# Patient Record
Sex: Male | Born: 1950 | Race: White | Hispanic: No | Marital: Married | Smoking: Former smoker
Health system: Southern US, Community
[De-identification: ages and names within clinical notes are randomized; demographics above are authoritative.]

## PROBLEM LIST (undated history)

## (undated) DIAGNOSIS — I2510 Atherosclerotic heart disease of native coronary artery without angina pectoris: Secondary | ICD-10-CM

## (undated) DIAGNOSIS — E119 Type 2 diabetes mellitus without complications: Secondary | ICD-10-CM

## (undated) DIAGNOSIS — I10 Essential (primary) hypertension: Secondary | ICD-10-CM

---

## 1998-06-01 ENCOUNTER — Emergency Department (HOSPITAL_COMMUNITY): Admission: EM | Admit: 1998-06-01 | Discharge: 1998-06-01 | Payer: Self-pay | Admitting: Emergency Medicine

## 1998-06-01 ENCOUNTER — Encounter: Payer: Self-pay | Admitting: Emergency Medicine

## 1998-06-01 ENCOUNTER — Ambulatory Visit (HOSPITAL_BASED_OUTPATIENT_CLINIC_OR_DEPARTMENT_OTHER): Admission: RE | Admit: 1998-06-01 | Discharge: 1998-06-01 | Payer: Self-pay | Admitting: Orthopedic Surgery

## 2010-07-09 ENCOUNTER — Ambulatory Visit (HOSPITAL_BASED_OUTPATIENT_CLINIC_OR_DEPARTMENT_OTHER)
Admission: RE | Admit: 2010-07-09 | Discharge: 2010-07-09 | Disposition: A | Discharge status: Home / Self Care | Payer: BC Managed Care – PPO | Source: Ambulatory Visit | Attending: Orthopedic Surgery | Admitting: Orthopedic Surgery

## 2010-07-09 DIAGNOSIS — X58XXXA Exposure to other specified factors, initial encounter: Secondary | ICD-10-CM | POA: Insufficient documentation

## 2010-07-09 DIAGNOSIS — IMO0002 Reserved for concepts with insufficient information to code with codable children: Secondary | ICD-10-CM | POA: Insufficient documentation

## 2010-07-09 DIAGNOSIS — Z01812 Encounter for preprocedural laboratory examination: Secondary | ICD-10-CM | POA: Insufficient documentation

## 2010-07-09 LAB — POCT I-STAT 4, (NA,K, GLUC, HGB,HCT)
Glucose, Bld: 124 mg/dL — ABNORMAL HIGH (ref 70–99)
HCT: 44 % (ref 39.0–52.0)
Hemoglobin: 15 g/dL (ref 13.0–17.0)
Potassium: 3.9 mEq/L (ref 3.5–5.1)
Sodium: 139 mEq/L (ref 135–145)

## 2010-07-13 ENCOUNTER — Ambulatory Visit (HOSPITAL_BASED_OUTPATIENT_CLINIC_OR_DEPARTMENT_OTHER)
Admission: RE | Admit: 2010-07-13 | Discharge: 2010-07-13 | Disposition: A | Discharge status: Home / Self Care | Payer: BC Managed Care – PPO | Source: Ambulatory Visit | Attending: Orthopedic Surgery | Admitting: Orthopedic Surgery

## 2010-07-13 DIAGNOSIS — Z01812 Encounter for preprocedural laboratory examination: Secondary | ICD-10-CM | POA: Insufficient documentation

## 2010-07-13 DIAGNOSIS — IMO0002 Reserved for concepts with insufficient information to code with codable children: Secondary | ICD-10-CM | POA: Insufficient documentation

## 2010-07-13 DIAGNOSIS — M6750 Plica syndrome, unspecified knee: Secondary | ICD-10-CM | POA: Insufficient documentation

## 2010-07-13 DIAGNOSIS — M2240 Chondromalacia patellae, unspecified knee: Secondary | ICD-10-CM | POA: Insufficient documentation

## 2010-07-13 DIAGNOSIS — X58XXXA Exposure to other specified factors, initial encounter: Secondary | ICD-10-CM | POA: Insufficient documentation

## 2010-07-13 LAB — GLUCOSE, CAPILLARY
Glucose-Capillary: 145 mg/dL — ABNORMAL HIGH (ref 70–99)
Glucose-Capillary: 163 mg/dL — ABNORMAL HIGH (ref 70–99)

## 2010-09-14 NOTE — Op Note (Signed)
  NAMEGRAEDEN, BITNER NO.:  [id number]  MEDICAL RECORD NO.:  [id number]          PATIENT TYPE:  AMB  LOCATION:  [facility]                         FACILITY:  [facility]  PHYSICIAN:  Almedia Balls. Ranell Patrick, M.D. DATE OF BIRTH:  1950/08/26  DATE OF PROCEDURE:  07/13/2010 DATE OF DISCHARGE:                              OPERATIVE REPORT   POSTOPERATIVE DIAGNOSIS:  Left knee medial meniscus tear.  POSTOPERATIVE DIAGNOSES: 1. Left knee medial meniscus tear. 2. Left knee medial plica shelf syndrome.  PROCEDURE PERFORMED:  Left knee arthroscopy with partial medial meniscectomy and plica excision.  ATTENDING SURGEON:  Almedia Balls. Ranell Patrick, M.D.  ASSISTANT:  None.  ANESTHESIA:  General anesthesia with MAC was used plus local.  ESTIMATED BLOOD LOSS:  Minimal.  FLUID REPLACEMENT:  800 cc crystalloid.  INSTRUMENT COUNTS:  Correct.  COMPLICATIONS.:  There were no complications.  Perioperative antibiotics were given.  INDICATIONS:  The patient is a 60 year old male with MRI documented torn medial meniscus.  He has failed conservative management, desires operative treatment to restore function and eliminate pain to his left knee.  DESCRIPTION OF PROCEDURE:  Informed consent was obtained.  After an adequate level of anesthesia achieved, time-out was called, left leg correctly identified, placement of arthroscopic leg holder, right leg placed in well leg holder after sterile prep and drape of the left knee. We entered the knee using standard portals including superolateral outflow, anterolateral scope, and anteromedial working portals.  We identified normal patellofemoral articular cartilage.  No signs of any chondromalacia.  There was mild hyperemia present of the joint and there was a large medial plica with engaged medial femoral condyle, there was some abraded cartilage there where the plica engaged.  We removed the plica using basket forceps and motorized shaver.  Medial  compartment was entered and we found a torn medial  meniscus.  The meniscus was torn in the posterior portion extending towards the middle part of meniscus, portion of meniscus was flipped back up on to the meniscus and incarcerated out of the tibial portion, we performed a partial meniscectomy using basket forceps and motorized shaver back to stable meniscal rim, about 30% to 40% of posterior horn of the medial meniscus had to be removed.  Remaining meniscus was probed and felt to be stable. There was minimal chondromalacia on the femur and tibia in that compartment.  ACL, PCL intact.  Lateral compartment pristine with no meniscal tear or articular cartilage defect.  Did a final inspection of the knee making sure there were no other loose pieces of cartilage. Once we were satisfied the gutters were clear and the meniscus debridement and plical excision was completed then we concluded surgery, suturing the wounds with 4-0 Monocryl followed by Steri-Strips and sterile compressive bandage.  The patient tolerated the surgery well.     Almedia Balls. Ranell Patrick, M.D.     SRN/MEDQ  D:  07/13/2010  T:  07/13/2010  Job:  [id number]  Electronically Signed by Malon Kindle  on 09/14/2010 12:04:23 AM

## 2014-07-24 DIAGNOSIS — R200 Anesthesia of skin: Secondary | ICD-10-CM | POA: Diagnosis not present

## 2014-07-24 DIAGNOSIS — R739 Hyperglycemia, unspecified: Secondary | ICD-10-CM | POA: Diagnosis not present

## 2014-07-24 DIAGNOSIS — I10 Essential (primary) hypertension: Secondary | ICD-10-CM | POA: Diagnosis not present

## 2014-07-24 DIAGNOSIS — M179 Osteoarthritis of knee, unspecified: Secondary | ICD-10-CM | POA: Diagnosis not present

## 2014-07-24 DIAGNOSIS — A681 Tick-borne relapsing fever: Secondary | ICD-10-CM | POA: Diagnosis not present

## 2014-07-24 DIAGNOSIS — R202 Paresthesia of skin: Secondary | ICD-10-CM | POA: Diagnosis not present

## 2014-08-01 DIAGNOSIS — I10 Essential (primary) hypertension: Secondary | ICD-10-CM | POA: Diagnosis not present

## 2014-08-01 DIAGNOSIS — A932 Colorado tick fever: Secondary | ICD-10-CM | POA: Diagnosis not present

## 2014-08-01 DIAGNOSIS — A681 Tick-borne relapsing fever: Secondary | ICD-10-CM | POA: Diagnosis not present

## 2014-08-01 DIAGNOSIS — R0602 Shortness of breath: Secondary | ICD-10-CM | POA: Diagnosis not present

## 2014-08-23 DIAGNOSIS — G629 Polyneuropathy, unspecified: Secondary | ICD-10-CM | POA: Diagnosis not present

## 2014-08-23 DIAGNOSIS — M1990 Unspecified osteoarthritis, unspecified site: Secondary | ICD-10-CM | POA: Diagnosis not present

## 2014-08-23 DIAGNOSIS — E780 Pure hypercholesterolemia: Secondary | ICD-10-CM | POA: Diagnosis not present

## 2014-08-23 DIAGNOSIS — E1165 Type 2 diabetes mellitus with hyperglycemia: Secondary | ICD-10-CM | POA: Diagnosis not present

## 2014-08-26 DIAGNOSIS — D510 Vitamin B12 deficiency anemia due to intrinsic factor deficiency: Secondary | ICD-10-CM | POA: Diagnosis not present

## 2014-09-02 DIAGNOSIS — D510 Vitamin B12 deficiency anemia due to intrinsic factor deficiency: Secondary | ICD-10-CM | POA: Diagnosis not present

## 2014-09-08 DIAGNOSIS — D510 Vitamin B12 deficiency anemia due to intrinsic factor deficiency: Secondary | ICD-10-CM | POA: Diagnosis not present

## 2014-09-16 DIAGNOSIS — D510 Vitamin B12 deficiency anemia due to intrinsic factor deficiency: Secondary | ICD-10-CM | POA: Diagnosis not present

## 2014-09-23 DIAGNOSIS — M79672 Pain in left foot: Secondary | ICD-10-CM | POA: Diagnosis not present

## 2014-09-23 DIAGNOSIS — Z0000 Encounter for general adult medical examination without abnormal findings: Secondary | ICD-10-CM | POA: Diagnosis not present

## 2014-09-23 DIAGNOSIS — D510 Vitamin B12 deficiency anemia due to intrinsic factor deficiency: Secondary | ICD-10-CM | POA: Diagnosis not present

## 2014-09-23 DIAGNOSIS — G8929 Other chronic pain: Secondary | ICD-10-CM | POA: Diagnosis not present

## 2014-09-23 DIAGNOSIS — E1143 Type 2 diabetes mellitus with diabetic autonomic (poly)neuropathy: Secondary | ICD-10-CM | POA: Diagnosis not present

## 2014-11-12 ENCOUNTER — Encounter (HOSPITAL_COMMUNITY): Payer: Self-pay | Admitting: Emergency Medicine

## 2014-11-12 ENCOUNTER — Emergency Department (HOSPITAL_COMMUNITY)
Admission: EM | Admit: 2014-11-12 | Discharge: 2014-11-13 | Disposition: A | Discharge status: Home / Self Care | Payer: Medicare Other | Attending: Emergency Medicine | Admitting: Emergency Medicine

## 2014-11-12 ENCOUNTER — Emergency Department (HOSPITAL_COMMUNITY): Payer: Medicare Other

## 2014-11-12 DIAGNOSIS — T1490XA Injury, unspecified, initial encounter: Secondary | ICD-10-CM

## 2014-11-12 DIAGNOSIS — Y998 Other external cause status: Secondary | ICD-10-CM | POA: Diagnosis not present

## 2014-11-12 DIAGNOSIS — E119 Type 2 diabetes mellitus without complications: Secondary | ICD-10-CM | POA: Insufficient documentation

## 2014-11-12 DIAGNOSIS — Y9389 Activity, other specified: Secondary | ICD-10-CM | POA: Insufficient documentation

## 2014-11-12 DIAGNOSIS — I447 Left bundle-branch block, unspecified: Secondary | ICD-10-CM | POA: Insufficient documentation

## 2014-11-12 DIAGNOSIS — Z880 Allergy status to penicillin: Secondary | ICD-10-CM | POA: Diagnosis not present

## 2014-11-12 DIAGNOSIS — S62514A Nondisplaced fracture of proximal phalanx of right thumb, initial encounter for closed fracture: Secondary | ICD-10-CM | POA: Diagnosis not present

## 2014-11-12 DIAGNOSIS — S62201A Unspecified fracture of first metacarpal bone, right hand, initial encounter for closed fracture: Secondary | ICD-10-CM | POA: Diagnosis not present

## 2014-11-12 DIAGNOSIS — Z041 Encounter for examination and observation following transport accident: Secondary | ICD-10-CM | POA: Diagnosis not present

## 2014-11-12 DIAGNOSIS — S6991XA Unspecified injury of right wrist, hand and finger(s), initial encounter: Secondary | ICD-10-CM | POA: Diagnosis present

## 2014-11-12 DIAGNOSIS — I10 Essential (primary) hypertension: Secondary | ICD-10-CM | POA: Insufficient documentation

## 2014-11-12 DIAGNOSIS — R000 Tachycardia, unspecified: Secondary | ICD-10-CM | POA: Diagnosis not present

## 2014-11-12 DIAGNOSIS — M79641 Pain in right hand: Secondary | ICD-10-CM | POA: Diagnosis not present

## 2014-11-12 DIAGNOSIS — Y92410 Unspecified street and highway as the place of occurrence of the external cause: Secondary | ICD-10-CM | POA: Insufficient documentation

## 2014-11-12 DIAGNOSIS — Z87891 Personal history of nicotine dependence: Secondary | ICD-10-CM | POA: Diagnosis not present

## 2014-11-12 DIAGNOSIS — R0689 Other abnormalities of breathing: Secondary | ICD-10-CM | POA: Diagnosis not present

## 2014-11-12 DIAGNOSIS — S0081XA Abrasion of other part of head, initial encounter: Secondary | ICD-10-CM | POA: Diagnosis not present

## 2014-11-12 DIAGNOSIS — T148 Other injury of unspecified body region: Secondary | ICD-10-CM | POA: Diagnosis not present

## 2014-11-12 DIAGNOSIS — S62511A Displaced fracture of proximal phalanx of right thumb, initial encounter for closed fracture: Secondary | ICD-10-CM | POA: Diagnosis not present

## 2014-11-12 DIAGNOSIS — I2510 Atherosclerotic heart disease of native coronary artery without angina pectoris: Secondary | ICD-10-CM | POA: Diagnosis not present

## 2014-11-12 DIAGNOSIS — S3991XA Unspecified injury of abdomen, initial encounter: Secondary | ICD-10-CM | POA: Diagnosis not present

## 2014-11-12 DIAGNOSIS — S299XXA Unspecified injury of thorax, initial encounter: Secondary | ICD-10-CM | POA: Diagnosis not present

## 2014-11-12 DIAGNOSIS — S62501A Fracture of unspecified phalanx of right thumb, initial encounter for closed fracture: Secondary | ICD-10-CM

## 2014-11-12 HISTORY — DX: Type 2 diabetes mellitus without complications: E11.9

## 2014-11-12 HISTORY — DX: Atherosclerotic heart disease of native coronary artery without angina pectoris: I25.10

## 2014-11-12 HISTORY — DX: Essential (primary) hypertension: I10

## 2014-11-12 LAB — CBC WITH DIFFERENTIAL/PLATELET
BASOS ABS: 0 10*3/uL (ref 0.0–0.1)
BASOS PCT: 0 %
EOS PCT: 1 %
Eosinophils Absolute: 0.1 10*3/uL (ref 0.0–0.7)
HCT: 45 % (ref 39.0–52.0)
Hemoglobin: 15.6 g/dL (ref 13.0–17.0)
Lymphocytes Relative: 14 %
Lymphs Abs: 1.1 10*3/uL (ref 0.7–4.0)
MCH: 32.8 pg (ref 26.0–34.0)
MCHC: 34.7 g/dL (ref 30.0–36.0)
MCV: 94.5 fL (ref 78.0–100.0)
MONO ABS: 0.5 10*3/uL (ref 0.1–1.0)
Monocytes Relative: 7 %
NEUTROS ABS: 6.1 10*3/uL (ref 1.7–7.7)
Neutrophils Relative %: 78 %
PLATELETS: 168 10*3/uL (ref 150–400)
RBC: 4.76 MIL/uL (ref 4.22–5.81)
RDW: 13.3 % (ref 11.5–15.5)
WBC: 7.8 10*3/uL (ref 4.0–10.5)

## 2014-11-12 MED ORDER — HYDROMORPHONE HCL 1 MG/ML IJ SOLN
1.0000 mg | Freq: Once | INTRAMUSCULAR | Status: AC
  Administered 2014-11-12: 1 mg via INTRAVENOUS
  Filled 2014-11-12: qty 1
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
## 2014-11-13 ENCOUNTER — Emergency Department (HOSPITAL_COMMUNITY): Payer: Medicare Other

## 2014-11-13 ENCOUNTER — Encounter (HOSPITAL_COMMUNITY): Payer: Self-pay | Admitting: Radiology

## 2014-11-13 DIAGNOSIS — S3991XA Unspecified injury of abdomen, initial encounter: Secondary | ICD-10-CM | POA: Diagnosis not present

## 2014-11-13 DIAGNOSIS — Z041 Encounter for examination and observation following transport accident: Secondary | ICD-10-CM | POA: Diagnosis not present

## 2014-11-13 DIAGNOSIS — J0101 Acute recurrent maxillary sinusitis: Secondary | ICD-10-CM | POA: Diagnosis not present

## 2014-11-13 DIAGNOSIS — R0689 Other abnormalities of breathing: Secondary | ICD-10-CM | POA: Diagnosis not present

## 2014-11-13 DIAGNOSIS — S299XXA Unspecified injury of thorax, initial encounter: Secondary | ICD-10-CM | POA: Diagnosis not present

## 2014-11-13 LAB — COMPREHENSIVE METABOLIC PANEL
ALBUMIN: 3.9 g/dL (ref 3.5–5.0)
ALK PHOS: 78 U/L (ref 38–126)
ALT: 34 U/L (ref 17–63)
ANION GAP: 12 (ref 5–15)
AST: 48 U/L — ABNORMAL HIGH (ref 15–41)
BUN: 13 mg/dL (ref 6–20)
CALCIUM: 8.4 mg/dL — AB (ref 8.9–10.3)
CO2: 20 mmol/L — AB (ref 22–32)
Chloride: 100 mmol/L — ABNORMAL LOW (ref 101–111)
Creatinine, Ser: 0.9 mg/dL (ref 0.61–1.24)
GFR calc non Af Amer: >60 mL/min (ref >60)
Glucose, Bld: 197 mg/dL — ABNORMAL HIGH (ref 65–99)
POTASSIUM: 3.6 mmol/L (ref 3.5–5.1)
SODIUM: 132 mmol/L — AB (ref 135–145)
TOTAL PROTEIN: 6.7 g/dL (ref 6.5–8.1)
Total Bilirubin: 1 mg/dL (ref 0.3–1.2)

## 2014-11-13 LAB — TYPE AND SCREEN
ABO/RH(D): A POS
ANTIBODY SCREEN: NEGATIVE

## 2014-11-13 LAB — I-STAT TROPONIN, ED
Troponin i, poc: 0.01 ng/mL (ref 0.00–0.08)
Troponin i, poc: 0.02 ng/mL (ref 0.00–0.08)

## 2014-11-13 MED ORDER — SODIUM CHLORIDE 0.9 % IV BOLUS (SEPSIS)
1000.0000 mL | Freq: Once | INTRAVENOUS | Status: AC
  Administered 2014-11-13: 1000 mL via INTRAVENOUS

## 2014-11-13 MED ORDER — HALOPERIDOL LACTATE 5 MG/ML IJ SOLN
INTRAMUSCULAR | Status: AC
  Administered 2014-11-13: 5 mg
  Filled 2014-11-13: qty 1

## 2014-11-13 MED ORDER — IOHEXOL 300 MG/ML  SOLN
100.0000 mL | Freq: Once | INTRAMUSCULAR | Status: DC | PRN
Start: 2014-11-13 — End: 2014-11-13

## 2014-11-13 MED ORDER — HALOPERIDOL LACTATE 5 MG/ML IJ SOLN
INTRAMUSCULAR | Status: AC
  Filled 2014-11-13: qty 1

## 2014-11-13 MED ORDER — HALOPERIDOL LACTATE 5 MG/ML IJ SOLN: 3.0000 mg | Freq: Once | INTRAMUSCULAR | Status: DC

## 2014-11-13 MED ORDER — MELOXICAM 7.5 MG PO TABS
7.5000 mg | ORAL_TABLET | Freq: Every day | ORAL | Status: AC
Start: 2014-11-13 — End: ?

## 2014-11-13 MED ORDER — KETOROLAC TROMETHAMINE 30 MG/ML IJ SOLN
30.0000 mg | Freq: Once | INTRAMUSCULAR | Status: AC
  Administered 2014-11-13: 30 mg via INTRAVENOUS
  Filled 2014-11-13: qty 1

## 2014-11-13 NOTE — Discharge Instructions (Signed)
Cast Care The purpose of a cast is to protect and immobilize an injured part of the body. This may be necessary after fractures, surgery, or other injuries. Splints are another form of immobilization; however, they are usually not as rigid as a cast, which accommodates swelling of the injury while still maintaining immobilization. Splints are typically used in the immediate post injury or postoperative period, before changing to a cast.  Before the rigid material of a cast is formed, a layer of padding is first applied to protect the injury. The rigid portion of a cast is created by wrapping gauze saturated with plaster of paris around the injury; alternatively, the cast may be made of fiberglass. During the period of immobilization, a cast may need to be changed multiple times. The length of immobilization is dependent on the severity of the injury and the time needed for healing. This period can vary from a couple weeks to many months. After a cast is created, radiographs (X-rays) through the cast determine if a satisfactory alignment of the bones was achieved. Radiographs may also be used throughout the healing period to check for signs of bone healing.  CARE OF THE CAST   Allow the cast to dry and harden completely before applying any pressure to it.  Applying pressure too early may create a point of excessive pressure on the skin, which may increase the risk of forming an ulcer. Drying time depends on the type of cast but may last as long as 24 hours.  When a cast gets wet, a soft area may appear. If this happens accidentally, return to the doctor's office, emergency room, or outpatient surgical facility as soon as possible for repairs or changing of the cast.  Do not get sand in the cast.  Do not place anything inside the cast. This includes items intended to scratch an area of skin that itches. ITCHING INSIDE A CAST   It is very common for a person with a cast to experience an itching  sensation under the cast.  Do not scratch any area under the cast even if the area is within reach. The skin under a cast in an environment of increased risk for injury.  Do not put anything in the cast.  If there is no wound, such as an incision from surgery, you may sprinkle cornstarch into the cast to relieve itching.  If a wound is present under the cast, consult your caregiver for pain medication or medication to reduce itching.  Using a hairdryer (on the cold setting) is a useful technique for reducing an itching sensation. CARE OF THE PATIENT IN A CAST It is important to elevate the body part that is in a cast to a level equal to or above that of the heart whenever possible. Elevating the injured body part may reduce the likelihood of swelling. Elevation of a leg in a cast may be achieved by resting the leg on a pillow when in bed and on a footstool or chair when sitting. For an arm in a cast, rest the arm on a pillow placed on the chest.  No matter how well one follows the necessary precautions, excessive swelling may occur under the cast. Signs and symptoms of excessive swelling include:  Severe and persistent pain.  Change in color of the tissues beyond the end of the cast, such as a change to blue or gray under the nails of the fingers or toes.  Coldness of the tissues beyond the cast when   the rest of the body is warm.  Numbness or complete loss of feeling in the skin beyond the cast.  Feeling of tightness under the cast after it dries.  Swelling of the tissue to a greater extent than was present before the cast was applied.  For a leg cast, inability to raise the big toe. If any of these signs or symptoms occur, contact your caregiver or an emergency room as soon as possible for treatment.  Infection Inside a Cast On occasion, an injury may become infected during healing. The most important way to fight an infection is to detect it early; however, early detection may be  difficult if the infected area is covered by a cast. Infection should be reported immediately to your caregiver. The following are common signs and symptoms of infection:   Foul smell.  Fever greater than 101F (38.3C) (may be accompanied by a general ill feeling).  Leakage of fluid through the cast.  Increasing pain or soreness of the skin under the cast. Bathing with a Cast Bathing is often a difficult task with a cast. The cast must be kept dry at all times, unless otherwise specified by your caregiver. If the cast is on a limb, such as your arm or leg, it is often easier to take a bath with the extremity in a cast propped up on the side of the tub or a chair, out of the water. If the cast is on the trunk of the body, you should take sponge baths until the cast is removed.  Document Released: 02/11/2005 Document Revised: 06/28/2013 Document Reviewed: 05/26/2008 Exit[employer] Patient Information 2015 [employer]. This information is not intended to replace advice given to you by your health care provider. Make sure you discuss any questions you have with your health care provider.  

## 2014-11-13 NOTE — Progress Notes (Signed)
Orthopedic Tech Progress Note Patient Details:  Donald White 1951/01/30 [id number]  Ortho Devices Type of Ortho Device: Arm sling, Thumb spica splint Splint Material: Plaster Ortho Device/Splint Location: RUE Ortho Device/Splint Interventions: Application   Asia R Thompson 11/13/2014, 2:20 AM

## 2014-11-13 NOTE — ED Notes (Signed)
Pt no following instructions and moving all over the bed even when oriented of possible danger to him self with this behavior, Dr. Saralyn Pilar notified.
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
## 2014-11-13 NOTE — ED Notes (Signed)
Ortho tech notified to splint pt hand.

## 2014-11-13 NOTE — ED Provider Notes (Signed)
EKG Interpretation  Date/Time:  Sunday November 13 2014 02:56:35 EDT Ventricular Rate:  114 PR Interval:  166 QRS Duration: 133 QT Interval:  334 QTC Calculation: 460 R Axis:   41 Text Interpretation:  Sinus tachycardia Ventricular premature complex Left bundle branch block Confirmed by [facility]  MD, Morene Antu ([id number]) on 11/13/2014 3:18:30 AM       Ruled out for MI in the ED given LBBB.  Instructed to follow up with both hand surgery for fracture and cardiology for outpatient care of his Left bundle.  Verbalizes understanding and agrees to follow up  April Palumbo, MD 11/13/14 7829

## 2014-11-13 NOTE — ED Notes (Signed)
Belongings given to the pt, pt assisted to the waiting room on [facility] with ED staff. Pt still c/o sore throat at dc time, no swollen noticed, no SOB, NAD at dc time.

## 2014-11-13 NOTE — ED Notes (Signed)
Pt states he doesn't know what medications he takes at home.

## 2014-11-14 LAB — ABO/RH: ABO/RH(D): A POS

## 2014-11-15 MED ORDER — IOHEXOL 300 MG/ML  SOLN
100.0000 mL | Freq: Once | INTRAMUSCULAR | Status: AC | PRN
  Administered 2014-11-13: 100 mL via INTRAVENOUS

## 2015-02-19 DIAGNOSIS — Z880 Allergy status to penicillin: Secondary | ICD-10-CM | POA: Diagnosis not present

## 2015-02-19 DIAGNOSIS — S31839A Unspecified open wound of anus, initial encounter: Secondary | ICD-10-CM | POA: Diagnosis not present

## 2015-02-19 DIAGNOSIS — Z87891 Personal history of nicotine dependence: Secondary | ICD-10-CM | POA: Diagnosis not present

## 2015-02-19 DIAGNOSIS — K610 Anal abscess: Secondary | ICD-10-CM | POA: Diagnosis not present

## 2015-02-19 DIAGNOSIS — K612 Anorectal abscess: Secondary | ICD-10-CM | POA: Diagnosis not present

## 2015-02-19 DIAGNOSIS — T7421XA Adult sexual abuse, confirmed, initial encounter: Secondary | ICD-10-CM | POA: Diagnosis not present

## 2015-02-23 DIAGNOSIS — Z87891 Personal history of nicotine dependence: Secondary | ICD-10-CM | POA: Diagnosis not present

## 2015-02-23 DIAGNOSIS — K611 Rectal abscess: Secondary | ICD-10-CM | POA: Diagnosis not present

## 2015-06-23 DIAGNOSIS — E1165 Type 2 diabetes mellitus with hyperglycemia: Secondary | ICD-10-CM | POA: Diagnosis not present

## 2015-06-23 DIAGNOSIS — I10 Essential (primary) hypertension: Secondary | ICD-10-CM | POA: Diagnosis not present

## 2015-07-25 DIAGNOSIS — R51 Headache: Secondary | ICD-10-CM | POA: Diagnosis not present

## 2015-12-13 DIAGNOSIS — M79672 Pain in left foot: Secondary | ICD-10-CM | POA: Diagnosis not present

## 2015-12-13 DIAGNOSIS — Z1159 Encounter for screening for other viral diseases: Secondary | ICD-10-CM | POA: Diagnosis not present

## 2015-12-14 DIAGNOSIS — J0100 Acute maxillary sinusitis, unspecified: Secondary | ICD-10-CM | POA: Diagnosis not present

## 2015-12-14 DIAGNOSIS — E119 Type 2 diabetes mellitus without complications: Secondary | ICD-10-CM | POA: Diagnosis not present

## 2015-12-14 DIAGNOSIS — R5383 Other fatigue: Secondary | ICD-10-CM | POA: Diagnosis not present

## 2015-12-14 DIAGNOSIS — M79675 Pain in left toe(s): Secondary | ICD-10-CM | POA: Diagnosis not present

## 2016-01-17 DIAGNOSIS — E119 Type 2 diabetes mellitus without complications: Secondary | ICD-10-CM | POA: Diagnosis not present

## 2016-01-17 DIAGNOSIS — N529 Male erectile dysfunction, unspecified: Secondary | ICD-10-CM | POA: Diagnosis not present

## 2016-01-17 DIAGNOSIS — M79675 Pain in left toe(s): Secondary | ICD-10-CM | POA: Diagnosis not present

## 2016-01-30 DIAGNOSIS — M2022 Hallux rigidus, left foot: Secondary | ICD-10-CM | POA: Diagnosis not present

## 2016-03-04 DIAGNOSIS — E119 Type 2 diabetes mellitus without complications: Secondary | ICD-10-CM | POA: Diagnosis not present

## 2016-03-04 DIAGNOSIS — J09X2 Influenza due to identified novel influenza A virus with other respiratory manifestations: Secondary | ICD-10-CM | POA: Diagnosis not present

## 2016-03-04 DIAGNOSIS — Z880 Allergy status to penicillin: Secondary | ICD-10-CM | POA: Diagnosis not present

## 2016-03-04 DIAGNOSIS — I447 Left bundle-branch block, unspecified: Secondary | ICD-10-CM | POA: Diagnosis not present

## 2016-03-04 DIAGNOSIS — Z87891 Personal history of nicotine dependence: Secondary | ICD-10-CM | POA: Diagnosis not present

## 2016-03-04 DIAGNOSIS — R079 Chest pain, unspecified: Secondary | ICD-10-CM | POA: Diagnosis not present

## 2016-03-04 DIAGNOSIS — I10 Essential (primary) hypertension: Secondary | ICD-10-CM | POA: Diagnosis not present

## 2016-03-04 DIAGNOSIS — I2510 Atherosclerotic heart disease of native coronary artery without angina pectoris: Secondary | ICD-10-CM | POA: Diagnosis not present

## 2016-03-04 DIAGNOSIS — Z7984 Long term (current) use of oral hypoglycemic drugs: Secondary | ICD-10-CM | POA: Diagnosis not present

## 2016-03-05 DIAGNOSIS — R079 Chest pain, unspecified: Secondary | ICD-10-CM | POA: Diagnosis not present

## 2016-03-26 DIAGNOSIS — R079 Chest pain, unspecified: Secondary | ICD-10-CM | POA: Diagnosis not present

## 2016-03-26 DIAGNOSIS — R0602 Shortness of breath: Secondary | ICD-10-CM | POA: Diagnosis not present

## 2016-03-26 DIAGNOSIS — I447 Left bundle-branch block, unspecified: Secondary | ICD-10-CM | POA: Diagnosis not present

## 2016-06-25 DIAGNOSIS — E109 Type 1 diabetes mellitus without complications: Secondary | ICD-10-CM | POA: Diagnosis not present

## 2016-06-25 DIAGNOSIS — M25511 Pain in right shoulder: Secondary | ICD-10-CM | POA: Diagnosis not present

## 2016-06-25 DIAGNOSIS — M4692 Unspecified inflammatory spondylopathy, cervical region: Secondary | ICD-10-CM | POA: Diagnosis not present

## 2016-09-15 DIAGNOSIS — Z880 Allergy status to penicillin: Secondary | ICD-10-CM | POA: Diagnosis not present

## 2016-09-15 DIAGNOSIS — R079 Chest pain, unspecified: Secondary | ICD-10-CM | POA: Diagnosis not present

## 2016-09-15 DIAGNOSIS — M2550 Pain in unspecified joint: Secondary | ICD-10-CM | POA: Diagnosis not present

## 2016-09-15 DIAGNOSIS — E109 Type 1 diabetes mellitus without complications: Secondary | ICD-10-CM | POA: Diagnosis not present

## 2016-09-15 DIAGNOSIS — Z7984 Long term (current) use of oral hypoglycemic drugs: Secondary | ICD-10-CM | POA: Diagnosis not present

## 2016-09-15 DIAGNOSIS — E1165 Type 2 diabetes mellitus with hyperglycemia: Secondary | ICD-10-CM | POA: Diagnosis not present

## 2016-09-15 DIAGNOSIS — R739 Hyperglycemia, unspecified: Secondary | ICD-10-CM | POA: Diagnosis not present

## 2016-09-15 DIAGNOSIS — I2510 Atherosclerotic heart disease of native coronary artery without angina pectoris: Secondary | ICD-10-CM | POA: Diagnosis not present

## 2016-09-15 DIAGNOSIS — R110 Nausea: Secondary | ICD-10-CM | POA: Diagnosis not present

## 2016-09-15 DIAGNOSIS — Z87891 Personal history of nicotine dependence: Secondary | ICD-10-CM | POA: Diagnosis not present

## 2016-09-15 DIAGNOSIS — I10 Essential (primary) hypertension: Secondary | ICD-10-CM | POA: Diagnosis not present

## 2016-09-15 DIAGNOSIS — Z7982 Long term (current) use of aspirin: Secondary | ICD-10-CM | POA: Diagnosis not present

## 2016-10-08 DIAGNOSIS — R079 Chest pain, unspecified: Secondary | ICD-10-CM | POA: Diagnosis not present

## 2016-10-08 DIAGNOSIS — I2510 Atherosclerotic heart disease of native coronary artery without angina pectoris: Secondary | ICD-10-CM | POA: Diagnosis not present

## 2016-10-08 DIAGNOSIS — R0600 Dyspnea, unspecified: Secondary | ICD-10-CM | POA: Diagnosis not present

## 2016-10-08 DIAGNOSIS — R0789 Other chest pain: Secondary | ICD-10-CM | POA: Diagnosis not present

## 2016-10-08 DIAGNOSIS — A7740 Ehrlichiosis, unspecified: Secondary | ICD-10-CM | POA: Diagnosis not present

## 2016-10-08 DIAGNOSIS — J439 Emphysema, unspecified: Secondary | ICD-10-CM | POA: Diagnosis not present

## 2016-10-08 DIAGNOSIS — I2609 Other pulmonary embolism with acute cor pulmonale: Secondary | ICD-10-CM | POA: Diagnosis not present

## 2016-10-08 DIAGNOSIS — R0602 Shortness of breath: Secondary | ICD-10-CM | POA: Diagnosis not present

## 2016-10-08 DIAGNOSIS — I361 Nonrheumatic tricuspid (valve) insufficiency: Secondary | ICD-10-CM | POA: Diagnosis not present

## 2016-10-08 DIAGNOSIS — R9431 Abnormal electrocardiogram [ECG] [EKG]: Secondary | ICD-10-CM | POA: Diagnosis not present

## 2016-10-08 DIAGNOSIS — I340 Nonrheumatic mitral (valve) insufficiency: Secondary | ICD-10-CM | POA: Diagnosis not present

## 2016-10-08 DIAGNOSIS — Z87891 Personal history of nicotine dependence: Secondary | ICD-10-CM | POA: Diagnosis not present

## 2016-10-08 DIAGNOSIS — R000 Tachycardia, unspecified: Secondary | ICD-10-CM | POA: Diagnosis not present

## 2016-10-08 DIAGNOSIS — I700 Atherosclerosis of aorta: Secondary | ICD-10-CM | POA: Diagnosis not present

## 2016-10-08 DIAGNOSIS — E119 Type 2 diabetes mellitus without complications: Secondary | ICD-10-CM | POA: Diagnosis not present

## 2016-10-08 DIAGNOSIS — I517 Cardiomegaly: Secondary | ICD-10-CM | POA: Diagnosis not present

## 2016-10-08 DIAGNOSIS — I10 Essential (primary) hypertension: Secondary | ICD-10-CM | POA: Diagnosis not present

## 2016-10-08 DIAGNOSIS — I2699 Other pulmonary embolism without acute cor pulmonale: Secondary | ICD-10-CM | POA: Diagnosis not present

## 2016-10-09 DIAGNOSIS — I2609 Other pulmonary embolism with acute cor pulmonale: Secondary | ICD-10-CM | POA: Diagnosis not present

## 2016-10-09 DIAGNOSIS — I429 Cardiomyopathy, unspecified: Secondary | ICD-10-CM | POA: Diagnosis not present

## 2016-10-09 DIAGNOSIS — I2699 Other pulmonary embolism without acute cor pulmonale: Secondary | ICD-10-CM | POA: Diagnosis not present

## 2016-10-09 DIAGNOSIS — I160 Hypertensive urgency: Secondary | ICD-10-CM | POA: Diagnosis not present

## 2016-10-09 DIAGNOSIS — A7740 Ehrlichiosis, unspecified: Secondary | ICD-10-CM | POA: Diagnosis not present

## 2016-10-09 DIAGNOSIS — E119 Type 2 diabetes mellitus without complications: Secondary | ICD-10-CM | POA: Diagnosis not present

## 2016-10-09 DIAGNOSIS — R0602 Shortness of breath: Secondary | ICD-10-CM | POA: Diagnosis not present

## 2016-10-10 DIAGNOSIS — I429 Cardiomyopathy, unspecified: Secondary | ICD-10-CM | POA: Diagnosis not present

## 2016-10-10 DIAGNOSIS — I2609 Other pulmonary embolism with acute cor pulmonale: Secondary | ICD-10-CM | POA: Diagnosis not present

## 2016-10-10 DIAGNOSIS — E119 Type 2 diabetes mellitus without complications: Secondary | ICD-10-CM | POA: Diagnosis not present

## 2016-10-10 DIAGNOSIS — A7740 Ehrlichiosis, unspecified: Secondary | ICD-10-CM | POA: Diagnosis not present

## 2016-10-11 DIAGNOSIS — I2609 Other pulmonary embolism with acute cor pulmonale: Secondary | ICD-10-CM | POA: Diagnosis not present

## 2016-10-11 DIAGNOSIS — E119 Type 2 diabetes mellitus without complications: Secondary | ICD-10-CM | POA: Diagnosis not present

## 2016-10-11 DIAGNOSIS — I429 Cardiomyopathy, unspecified: Secondary | ICD-10-CM | POA: Diagnosis not present

## 2016-10-11 DIAGNOSIS — A7740 Ehrlichiosis, unspecified: Secondary | ICD-10-CM | POA: Diagnosis not present

## 2016-10-11 DIAGNOSIS — I160 Hypertensive urgency: Secondary | ICD-10-CM | POA: Diagnosis not present

## 2016-10-16 DIAGNOSIS — I420 Dilated cardiomyopathy: Secondary | ICD-10-CM | POA: Diagnosis not present

## 2016-12-11 DIAGNOSIS — R42 Dizziness and giddiness: Secondary | ICD-10-CM | POA: Diagnosis not present

## 2016-12-11 DIAGNOSIS — E119 Type 2 diabetes mellitus without complications: Secondary | ICD-10-CM | POA: Diagnosis not present

## 2016-12-11 DIAGNOSIS — I2510 Atherosclerotic heart disease of native coronary artery without angina pectoris: Secondary | ICD-10-CM | POA: Diagnosis not present

## 2016-12-11 DIAGNOSIS — I420 Dilated cardiomyopathy: Secondary | ICD-10-CM | POA: Diagnosis not present

## 2016-12-11 DIAGNOSIS — I110 Hypertensive heart disease with heart failure: Secondary | ICD-10-CM | POA: Diagnosis not present

## 2016-12-11 DIAGNOSIS — J439 Emphysema, unspecified: Secondary | ICD-10-CM | POA: Diagnosis not present

## 2016-12-11 DIAGNOSIS — I700 Atherosclerosis of aorta: Secondary | ICD-10-CM | POA: Diagnosis not present

## 2016-12-11 DIAGNOSIS — I509 Heart failure, unspecified: Secondary | ICD-10-CM | POA: Diagnosis not present

## 2016-12-11 DIAGNOSIS — Z87891 Personal history of nicotine dependence: Secondary | ICD-10-CM | POA: Diagnosis not present

## 2016-12-11 DIAGNOSIS — I447 Left bundle-branch block, unspecified: Secondary | ICD-10-CM | POA: Diagnosis not present

## 2016-12-11 DIAGNOSIS — R0602 Shortness of breath: Secondary | ICD-10-CM | POA: Diagnosis not present

## 2016-12-11 DIAGNOSIS — Z86711 Personal history of pulmonary embolism: Secondary | ICD-10-CM | POA: Diagnosis not present

## 2016-12-11 DIAGNOSIS — D72829 Elevated white blood cell count, unspecified: Secondary | ICD-10-CM | POA: Diagnosis not present

## 2016-12-12 DIAGNOSIS — Z87891 Personal history of nicotine dependence: Secondary | ICD-10-CM | POA: Diagnosis not present

## 2016-12-12 DIAGNOSIS — Z86711 Personal history of pulmonary embolism: Secondary | ICD-10-CM | POA: Diagnosis not present

## 2016-12-12 DIAGNOSIS — I110 Hypertensive heart disease with heart failure: Secondary | ICD-10-CM | POA: Diagnosis not present

## 2016-12-12 DIAGNOSIS — I517 Cardiomegaly: Secondary | ICD-10-CM | POA: Diagnosis not present

## 2016-12-12 DIAGNOSIS — E119 Type 2 diabetes mellitus without complications: Secondary | ICD-10-CM | POA: Diagnosis not present

## 2016-12-12 DIAGNOSIS — Z23 Encounter for immunization: Secondary | ICD-10-CM | POA: Diagnosis not present

## 2016-12-12 DIAGNOSIS — R748 Abnormal levels of other serum enzymes: Secondary | ICD-10-CM | POA: Diagnosis not present

## 2016-12-12 DIAGNOSIS — F419 Anxiety disorder, unspecified: Secondary | ICD-10-CM | POA: Diagnosis not present

## 2016-12-12 DIAGNOSIS — L03113 Cellulitis of right upper limb: Secondary | ICD-10-CM | POA: Diagnosis not present

## 2016-12-12 DIAGNOSIS — I340 Nonrheumatic mitral (valve) insufficiency: Secondary | ICD-10-CM | POA: Diagnosis not present

## 2016-12-12 DIAGNOSIS — M79641 Pain in right hand: Secondary | ICD-10-CM | POA: Diagnosis not present

## 2016-12-12 DIAGNOSIS — I509 Heart failure, unspecified: Secondary | ICD-10-CM | POA: Diagnosis not present

## 2016-12-12 DIAGNOSIS — R079 Chest pain, unspecified: Secondary | ICD-10-CM | POA: Diagnosis not present

## 2016-12-12 DIAGNOSIS — I2510 Atherosclerotic heart disease of native coronary artery without angina pectoris: Secondary | ICD-10-CM | POA: Diagnosis not present

## 2016-12-12 DIAGNOSIS — L0390 Cellulitis, unspecified: Secondary | ICD-10-CM | POA: Diagnosis not present

## 2016-12-12 DIAGNOSIS — I420 Dilated cardiomyopathy: Secondary | ICD-10-CM | POA: Diagnosis not present

## 2016-12-12 DIAGNOSIS — I519 Heart disease, unspecified: Secondary | ICD-10-CM | POA: Diagnosis not present

## 2019-07-24 ENCOUNTER — Emergency Department (HOSPITAL_COMMUNITY): Payer: Medicare HMO

## 2019-07-24 ENCOUNTER — Other Ambulatory Visit: Payer: Self-pay

## 2019-07-24 ENCOUNTER — Emergency Department (HOSPITAL_COMMUNITY)
Admission: EM | Admit: 2019-07-24 | Discharge: 2019-07-24 | Disposition: A | Discharge status: Home / Self Care | Payer: Medicare HMO | Attending: Emergency Medicine | Admitting: Emergency Medicine

## 2019-07-24 DIAGNOSIS — R911 Solitary pulmonary nodule: Secondary | ICD-10-CM | POA: Diagnosis not present

## 2019-07-24 DIAGNOSIS — Y906 Blood alcohol level of 120-199 mg/100 ml: Secondary | ICD-10-CM | POA: Insufficient documentation

## 2019-07-24 DIAGNOSIS — Y999 Unspecified external cause status: Secondary | ICD-10-CM | POA: Diagnosis not present

## 2019-07-24 DIAGNOSIS — Y939 Activity, unspecified: Secondary | ICD-10-CM | POA: Diagnosis not present

## 2019-07-24 DIAGNOSIS — F10120 Alcohol abuse with intoxication, uncomplicated: Secondary | ICD-10-CM | POA: Insufficient documentation

## 2019-07-24 DIAGNOSIS — R109 Unspecified abdominal pain: Secondary | ICD-10-CM | POA: Insufficient documentation

## 2019-07-24 DIAGNOSIS — T07XXXA Unspecified multiple injuries, initial encounter: Secondary | ICD-10-CM | POA: Diagnosis present

## 2019-07-24 DIAGNOSIS — Z20822 Contact with and (suspected) exposure to covid-19: Secondary | ICD-10-CM | POA: Diagnosis not present

## 2019-07-24 DIAGNOSIS — S060X9A Concussion with loss of consciousness of unspecified duration, initial encounter: Secondary | ICD-10-CM

## 2019-07-24 DIAGNOSIS — E860 Dehydration: Secondary | ICD-10-CM | POA: Insufficient documentation

## 2019-07-24 DIAGNOSIS — S060X0A Concussion without loss of consciousness, initial encounter: Secondary | ICD-10-CM | POA: Insufficient documentation

## 2019-07-24 DIAGNOSIS — E119 Type 2 diabetes mellitus without complications: Secondary | ICD-10-CM | POA: Diagnosis not present

## 2019-07-24 DIAGNOSIS — S161XXA Strain of muscle, fascia and tendon at neck level, initial encounter: Secondary | ICD-10-CM | POA: Diagnosis not present

## 2019-07-24 DIAGNOSIS — I2510 Atherosclerotic heart disease of native coronary artery without angina pectoris: Secondary | ICD-10-CM | POA: Insufficient documentation

## 2019-07-24 DIAGNOSIS — S298XXA Other specified injuries of thorax, initial encounter: Secondary | ICD-10-CM | POA: Insufficient documentation

## 2019-07-24 DIAGNOSIS — F1010 Alcohol abuse, uncomplicated: Secondary | ICD-10-CM

## 2019-07-24 DIAGNOSIS — Y929 Unspecified place or not applicable: Secondary | ICD-10-CM | POA: Insufficient documentation

## 2019-07-24 LAB — COMPREHENSIVE METABOLIC PANEL
ALT: 57 U/L — ABNORMAL HIGH (ref 0–44)
AST: 72 U/L — ABNORMAL HIGH (ref 15–41)
Albumin: 3.9 g/dL (ref 3.5–5.0)
Alkaline Phosphatase: 130 U/L — ABNORMAL HIGH (ref 38–126)
Anion gap: 16 — ABNORMAL HIGH (ref 5–15)
BUN: 11 mg/dL (ref 8–23)
CO2: 19 mmol/L — ABNORMAL LOW (ref 22–32)
Calcium: 9 mg/dL (ref 8.9–10.3)
Chloride: 102 mmol/L (ref 98–111)
Creatinine, Ser: 1.04 mg/dL (ref 0.61–1.24)
GFR calc Af Amer: >60 mL/min (ref >60)
GFR calc non Af Amer: >60 mL/min (ref >60)
Glucose, Bld: 244 mg/dL — ABNORMAL HIGH (ref 70–99)
Potassium: 3.5 mmol/L (ref 3.5–5.1)
Sodium: 137 mmol/L (ref 135–145)
Total Bilirubin: 0.5 mg/dL (ref 0.3–1.2)
Total Protein: 6.8 g/dL (ref 6.5–8.1)

## 2019-07-24 LAB — I-STAT CHEM 8, ED
BUN: 12 mg/dL (ref 8–23)
Calcium, Ion: 1.1 mmol/L — ABNORMAL LOW (ref 1.15–1.40)
Chloride: 101 mmol/L (ref 98–111)
Creatinine, Ser: 1.2 mg/dL (ref 0.61–1.24)
Glucose, Bld: 254 mg/dL — ABNORMAL HIGH (ref 70–99)
HCT: 47 % (ref 39.0–52.0)
Hemoglobin: 16 g/dL (ref 13.0–17.0)
Potassium: 3.1 mmol/L — ABNORMAL LOW (ref 3.5–5.1)
Sodium: 138 mmol/L (ref 135–145)
TCO2: 23 mmol/L (ref 22–32)

## 2019-07-24 LAB — URINALYSIS, ROUTINE W REFLEX MICROSCOPIC
Bacteria, UA: NONE SEEN
Bilirubin Urine: NEGATIVE
Glucose, UA: >=500 mg/dL — AB
Ketones, ur: NEGATIVE mg/dL
Leukocytes,Ua: NEGATIVE
Nitrite: NEGATIVE
Protein, ur: NEGATIVE mg/dL
Specific Gravity, Urine: 1.005 (ref 1.005–1.030)
pH: 6 (ref 5.0–8.0)

## 2019-07-24 LAB — CBC
HCT: 45.7 % (ref 39.0–52.0)
Hemoglobin: 15.9 g/dL (ref 13.0–17.0)
MCH: 31.7 pg (ref 26.0–34.0)
MCHC: 34.8 g/dL (ref 30.0–36.0)
MCV: 91 fL (ref 80.0–100.0)
Platelets: 144 10*3/uL — ABNORMAL LOW (ref 150–400)
RBC: 5.02 MIL/uL (ref 4.22–5.81)
RDW: 13.2 % (ref 11.5–15.5)
WBC: 7.9 10*3/uL (ref 4.0–10.5)
nRBC: 0 % (ref 0.0–0.2)

## 2019-07-24 LAB — SAMPLE TO BLOOD BANK

## 2019-07-24 LAB — RAPID URINE DRUG SCREEN, HOSP PERFORMED
Amphetamines: NOT DETECTED
Barbiturates: NOT DETECTED
Benzodiazepines: NOT DETECTED
Cocaine: NOT DETECTED
Opiates: NOT DETECTED
Tetrahydrocannabinol: NOT DETECTED

## 2019-07-24 LAB — BASIC METABOLIC PANEL
Anion gap: 12 (ref 5–15)
BUN: 8 mg/dL (ref 8–23)
CO2: 21 mmol/L — ABNORMAL LOW (ref 22–32)
Calcium: 8.5 mg/dL — ABNORMAL LOW (ref 8.9–10.3)
Chloride: 105 mmol/L (ref 98–111)
Creatinine, Ser: 0.91 mg/dL (ref 0.61–1.24)
GFR calc Af Amer: >60 mL/min (ref >60)
GFR calc non Af Amer: >60 mL/min (ref >60)
Glucose, Bld: 190 mg/dL — ABNORMAL HIGH (ref 70–99)
Potassium: 4.1 mmol/L (ref 3.5–5.1)
Sodium: 138 mmol/L (ref 135–145)

## 2019-07-24 LAB — PROTIME-INR
INR: 1 (ref 0.8–1.2)
Prothrombin Time: 12.9 seconds (ref 11.4–15.2)

## 2019-07-24 LAB — LACTIC ACID, PLASMA
Lactic Acid, Venous: 4 mmol/L (ref 0.5–1.9)
Lactic Acid, Venous: 2.9 mmol/L (ref 0.5–1.9)

## 2019-07-24 LAB — SARS CORONAVIRUS 2 BY RT PCR (HOSPITAL ORDER, PERFORMED IN ~~LOC~~ HOSPITAL LAB): SARS Coronavirus 2: NEGATIVE

## 2019-07-24 LAB — ETHANOL: Alcohol, Ethyl (B): 154 mg/dL — ABNORMAL HIGH (ref <10)

## 2019-07-24 MED ORDER — IOHEXOL 300 MG/ML  SOLN
100.0000 mL | Freq: Once | INTRAMUSCULAR | Status: AC | PRN
  Administered 2019-07-24: 100 mL via INTRAVENOUS

## 2019-07-24 MED ORDER — SODIUM CHLORIDE 0.9 % IV BOLUS (SEPSIS)
1000.0000 mL | Freq: Once | INTRAVENOUS | Status: AC
  Administered 2019-07-24: 1000 mL via INTRAVENOUS

## 2019-07-24 MED ORDER — POTASSIUM CHLORIDE CRYS ER 20 MEQ PO TBCR
40.0000 meq | EXTENDED_RELEASE_TABLET | Freq: Once | ORAL | Status: AC
  Administered 2019-07-24: 40 meq via ORAL
  Filled 2019-07-24: qty 2

## 2019-07-24 MED ORDER — LACTATED RINGERS IV BOLUS
1000.0000 mL | Freq: Once | INTRAVENOUS | Status: AC
  Administered 2019-07-24: 1000 mL via INTRAVENOUS

## 2019-07-24 MED ORDER — HYDROCODONE-ACETAMINOPHEN 5-325 MG PO TABS
1.0000 | ORAL_TABLET | Freq: Once | ORAL | Status: AC
  Administered 2019-07-24: 1 via ORAL
  Filled 2019-07-24: qty 1

## 2019-07-24 NOTE — ED Notes (Signed)
Pt was able to get up out of bed with assistance. Complained of right hip pain. Took pt off O2 which he was placed on. Ambulated slow with steady gait and maintained O2 of 97%. Pt was able to bear weight.

## 2019-07-24 NOTE — Discharge Instructions (Addendum)
You need follow-up CAT scan of your chest the next 3 months to evaluate for cancer
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
## 2019-07-24 NOTE — ED Notes (Signed)
Please Junior Spink  @ [phone] a status update--Leslie

## 2019-07-24 NOTE — ED Notes (Signed)
Provided pt with water and pt is tolerating well.
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
## 2021-05-26 DEATH — deceased
